# Patient Record
Sex: Female | Born: 1985 | Race: White | Hispanic: No | Marital: Single | Smoking: Current every day smoker
Health system: Southern US, Community
[De-identification: ages and names within clinical notes are randomized; demographics above are authoritative.]

## PROBLEM LIST (undated history)

## (undated) HISTORY — PX: MITRAL VALVE REPLACEMENT: SHX147

## (undated) HISTORY — PX: CHOLECYSTECTOMY: SHX55

---
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
## 2006-02-04 ENCOUNTER — Ambulatory Visit: Payer: Self-pay | Admitting: Oncology

## 2006-06-26 ENCOUNTER — Ambulatory Visit: Payer: Self-pay | Admitting: Oncology

## 2010-05-10 ENCOUNTER — Ambulatory Visit: Payer: Self-pay | Admitting: Internal Medicine

## 2010-05-10 ENCOUNTER — Inpatient Hospital Stay (HOSPITAL_COMMUNITY): Admission: EM | Admit: 2010-05-10 | Discharge: 2010-05-22 | Payer: Self-pay | Admitting: Pulmonary Disease

## 2010-05-10 ENCOUNTER — Ambulatory Visit: Payer: Self-pay | Admitting: Pulmonary Disease

## 2010-05-11 ENCOUNTER — Encounter (INDEPENDENT_AMBULATORY_CARE_PROVIDER_SITE_OTHER): Payer: Self-pay | Admitting: Pulmonary Disease

## 2010-12-15 LAB — POCT I-STAT 3, ART BLOOD GAS (G3+)
Acid-base deficit: 6 mmol/L — ABNORMAL HIGH (ref 0.0–2.0)
Acid-base deficit: 1 mmol/L (ref 0.0–2.0)
Acid-base deficit: 1 mmol/L (ref 0.0–2.0)
Acid-base deficit: 1 mmol/L (ref 0.0–2.0)
Acid-base deficit: 2 mmol/L (ref 0.0–2.0)
Acid-base deficit: 2 mmol/L (ref 0.0–2.0)
Acid-base deficit: 4 mmol/L — ABNORMAL HIGH (ref 0.0–2.0)
Acid-base deficit: 7 mmol/L — ABNORMAL HIGH (ref 0.0–2.0)
Bicarbonate: 19.7 mEq/L — ABNORMAL LOW (ref 20.0–24.0)
Bicarbonate: 20.1 mEq/L (ref 20.0–24.0)
Bicarbonate: 20.3 mEq/L (ref 20.0–24.0)
Bicarbonate: 20.7 mEq/L (ref 20.0–24.0)
Bicarbonate: 22.1 mEq/L (ref 20.0–24.0)
Bicarbonate: 23.8 mEq/L (ref 20.0–24.0)
Bicarbonate: 26.4 mEq/L — ABNORMAL HIGH (ref 20.0–24.0)
Bicarbonate: 29.2 mEq/L — ABNORMAL HIGH (ref 20.0–24.0)
O2 Saturation: 100 %
O2 Saturation: 100 %
O2 Saturation: 100 %
O2 Saturation: 95 %
O2 Saturation: 96 %
O2 Saturation: 98 %
O2 Saturation: 98 %
O2 Saturation: 98 %
O2 Saturation: 98 %
O2 Saturation: 99 %
Patient temperature: 101.6
Patient temperature: 100.2
Patient temperature: 100.4
Patient temperature: 100.5
Patient temperature: 101
Patient temperature: 101.3
Patient temperature: 102.1
Patient temperature: 102.1
Patient temperature: 37
Patient temperature: 98.6
TCO2: 21 mmol/L (ref 0–100)
TCO2: 21 mmol/L (ref 0–100)
TCO2: 21 mmol/L (ref 0–100)
TCO2: 22 mmol/L (ref 0–100)
TCO2: 23 mmol/L (ref 0–100)
TCO2: 25 mmol/L (ref 0–100)
TCO2: 26 mmol/L (ref 0–100)
TCO2: 27 mmol/L (ref 0–100)
TCO2: 28 mmol/L (ref 0–100)
TCO2: 28 mmol/L (ref 0–100)
TCO2: 29 mmol/L (ref 0–100)
TCO2: 31 mmol/L (ref 0–100)
TCO2: 31 mmol/L (ref 0–100)
pCO2 arterial: 35.8 mmHg (ref 35.0–45.0)
pCO2 arterial: 34.1 mmHg — ABNORMAL LOW (ref 35.0–45.0)
pCO2 arterial: 34.7 mmHg — ABNORMAL LOW (ref 35.0–45.0)
pCO2 arterial: 35.3 mmHg (ref 35.0–45.0)
pCO2 arterial: 41.4 mmHg (ref 35.0–45.0)
pCO2 arterial: 45.8 mmHg — ABNORMAL HIGH (ref 35.0–45.0)
pCO2 arterial: 47.6 mmHg — ABNORMAL HIGH (ref 35.0–45.0)
pCO2 arterial: 51 mmHg — ABNORMAL HIGH (ref 35.0–45.0)
pCO2 arterial: 55.8 mmHg — ABNORMAL HIGH (ref 35.0–45.0)
pCO2 arterial: 56.4 mmHg — ABNORMAL HIGH (ref 35.0–45.0)
pCO2 arterial: 56.4 mmHg — ABNORMAL HIGH (ref 35.0–45.0)
pCO2 arterial: 62.4 mmHg (ref 35.0–45.0)
pH, Arterial: 7.291 — ABNORMAL LOW (ref 7.350–7.400)
pH, Arterial: 7.356 (ref 7.350–7.400)
pH, Arterial: 7.278 — ABNORMAL LOW (ref 7.350–7.400)
pH, Arterial: 7.281 — ABNORMAL LOW (ref 7.350–7.400)
pH, Arterial: 7.291 — ABNORMAL LOW (ref 7.350–7.400)
pH, Arterial: 7.329 — ABNORMAL LOW (ref 7.350–7.400)
pH, Arterial: 7.333 — ABNORMAL LOW (ref 7.350–7.400)
pH, Arterial: 7.339 — ABNORMAL LOW (ref 7.350–7.400)
pH, Arterial: 7.368 (ref 7.350–7.400)
pH, Arterial: 7.38 (ref 7.350–7.400)
pH, Arterial: 7.383 (ref 7.350–7.400)
pO2, Arterial: 108 mmHg — ABNORMAL HIGH (ref 80.0–100.0)
pO2, Arterial: 100 mmHg (ref 80.0–100.0)
pO2, Arterial: 119 mmHg — ABNORMAL HIGH (ref 80.0–100.0)
pO2, Arterial: 122 mmHg — ABNORMAL HIGH (ref 80.0–100.0)
pO2, Arterial: 158 mmHg — ABNORMAL HIGH (ref 80.0–100.0)
pO2, Arterial: 212 mmHg — ABNORMAL HIGH (ref 80.0–100.0)
pO2, Arterial: 230 mmHg — ABNORMAL HIGH (ref 80.0–100.0)
pO2, Arterial: 252 mmHg — ABNORMAL HIGH (ref 80.0–100.0)
pO2, Arterial: 427 mmHg — ABNORMAL HIGH (ref 80.0–100.0)
pO2, Arterial: 61 mmHg — ABNORMAL LOW (ref 80.0–100.0)
pO2, Arterial: 98 mmHg (ref 80.0–100.0)

## 2010-12-15 LAB — VANCOMYCIN, TROUGH: Vancomycin Tr: 34.7 ug/mL (ref 10.0–20.0)

## 2010-12-15 LAB — COMPREHENSIVE METABOLIC PANEL
ALT: 35 U/L (ref 0–35)
AST: 54 U/L — ABNORMAL HIGH (ref 0–37)
AST: 48 U/L — ABNORMAL HIGH (ref 0–37)
Albumin: 2.1 g/dL — ABNORMAL LOW (ref 3.5–5.2)
Alkaline Phosphatase: 74 U/L (ref 39–117)
Alkaline Phosphatase: 63 U/L (ref 39–117)
BUN: 74 mg/dL — ABNORMAL HIGH (ref 6–23)
CO2: 18 mEq/L — ABNORMAL LOW (ref 19–32)
Calcium: 8.7 mg/dL (ref 8.4–10.5)
Chloride: 107 mEq/L (ref 96–112)
Chloride: 109 mEq/L (ref 96–112)
Chloride: 105 mEq/L (ref 96–112)
Creatinine, Ser: 6.18 mg/dL — ABNORMAL HIGH (ref 0.4–1.2)
GFR calc Af Amer: 16 mL/min — ABNORMAL LOW (ref >60)
GFR calc non Af Amer: 8 mL/min — ABNORMAL LOW (ref >60)
GFR calc non Af Amer: 8 mL/min — ABNORMAL LOW (ref >60)
Glucose, Bld: 150 mg/dL — ABNORMAL HIGH (ref 70–99)
Glucose, Bld: 98 mg/dL (ref 70–99)
Potassium: 4.4 mEq/L (ref 3.5–5.1)
Potassium: 4.7 mEq/L (ref 3.5–5.1)
Potassium: 4.6 mEq/L (ref 3.5–5.1)
Sodium: 141 mEq/L (ref 135–145)
Total Bilirubin: 0.6 mg/dL (ref 0.3–1.2)
Total Bilirubin: 1.6 mg/dL — ABNORMAL HIGH (ref 0.3–1.2)

## 2010-12-15 LAB — BASIC METABOLIC PANEL
BUN: 34 mg/dL — ABNORMAL HIGH (ref 6–23)
BUN: 74 mg/dL — ABNORMAL HIGH (ref 6–23)
BUN: 83 mg/dL — ABNORMAL HIGH (ref 6–23)
BUN: 19 mg/dL (ref 6–23)
BUN: 20 mg/dL (ref 6–23)
BUN: 25 mg/dL — ABNORMAL HIGH (ref 6–23)
BUN: 53 mg/dL — ABNORMAL HIGH (ref 6–23)
CO2: 20 mEq/L (ref 19–32)
CO2: 21 mEq/L (ref 19–32)
CO2: 19 mEq/L (ref 19–32)
CO2: 21 mEq/L (ref 19–32)
Calcium: 8.6 mg/dL (ref 8.4–10.5)
Calcium: 8.6 mg/dL (ref 8.4–10.5)
Calcium: 8.8 mg/dL (ref 8.4–10.5)
Calcium: 7.2 mg/dL — ABNORMAL LOW (ref 8.4–10.5)
Calcium: 8.4 mg/dL (ref 8.4–10.5)
Chloride: 104 mEq/L (ref 96–112)
Chloride: 105 mEq/L (ref 96–112)
Chloride: 109 mEq/L (ref 96–112)
Chloride: 109 mEq/L (ref 96–112)
Chloride: 110 mEq/L (ref 96–112)
Chloride: 101 mEq/L (ref 96–112)
Creatinine, Ser: 5.04 mg/dL — ABNORMAL HIGH (ref 0.4–1.2)
Creatinine, Ser: 1.85 mg/dL — ABNORMAL HIGH (ref 0.4–1.2)
Creatinine, Ser: 2.1 mg/dL — ABNORMAL HIGH (ref 0.4–1.2)
Creatinine, Ser: 5.07 mg/dL — ABNORMAL HIGH (ref 0.4–1.2)
Creatinine, Ser: 5.87 mg/dL — ABNORMAL HIGH (ref 0.4–1.2)
GFR calc Af Amer: 16 mL/min — ABNORMAL LOW (ref >60)
GFR calc Af Amer: 17 mL/min — ABNORMAL LOW (ref >60)
GFR calc Af Amer: 24 mL/min — ABNORMAL LOW (ref >60)
GFR calc Af Amer: 27 mL/min — ABNORMAL LOW (ref >60)
GFR calc Af Amer: 11 mL/min — ABNORMAL LOW (ref >60)
GFR calc Af Amer: 13 mL/min — ABNORMAL LOW (ref >60)
GFR calc Af Amer: 23 mL/min — ABNORMAL LOW (ref >60)
GFR calc non Af Amer: 10 mL/min — ABNORMAL LOW (ref >60)
GFR calc non Af Amer: 14 mL/min — ABNORMAL LOW (ref >60)
GFR calc non Af Amer: 15 mL/min — ABNORMAL LOW (ref >60)
GFR calc non Af Amer: 20 mL/min — ABNORMAL LOW (ref >60)
GFR calc non Af Amer: 23 mL/min — ABNORMAL LOW (ref >60)
GFR calc non Af Amer: 10 mL/min — ABNORMAL LOW (ref >60)
GFR calc non Af Amer: 19 mL/min — ABNORMAL LOW (ref >60)
GFR calc non Af Amer: 33 mL/min — ABNORMAL LOW (ref >60)
GFR calc non Af Amer: 9 mL/min — ABNORMAL LOW (ref >60)
Glucose, Bld: 109 mg/dL — ABNORMAL HIGH (ref 70–99)
Glucose, Bld: 151 mg/dL — ABNORMAL HIGH (ref 70–99)
Potassium: 2.8 mEq/L — ABNORMAL LOW (ref 3.5–5.1)
Potassium: 3.3 mEq/L — ABNORMAL LOW (ref 3.5–5.1)
Potassium: 3.4 mEq/L — ABNORMAL LOW (ref 3.5–5.1)
Potassium: 3.9 mEq/L (ref 3.5–5.1)
Potassium: 4 mEq/L (ref 3.5–5.1)
Potassium: 4.1 mEq/L (ref 3.5–5.1)
Potassium: 2.9 mEq/L — ABNORMAL LOW (ref 3.5–5.1)
Potassium: 3.4 mEq/L — ABNORMAL LOW (ref 3.5–5.1)
Potassium: 3.5 mEq/L (ref 3.5–5.1)
Potassium: 4.3 mEq/L (ref 3.5–5.1)
Sodium: 139 mEq/L (ref 135–145)
Sodium: 140 mEq/L (ref 135–145)
Sodium: 141 mEq/L (ref 135–145)
Sodium: 141 mEq/L (ref 135–145)
Sodium: 142 mEq/L (ref 135–145)
Sodium: 142 mEq/L (ref 135–145)
Sodium: 137 mEq/L (ref 135–145)
Sodium: 138 mEq/L (ref 135–145)

## 2010-12-15 LAB — CBC
HCT: 17.9 % — ABNORMAL LOW (ref 36.0–46.0)
HCT: 22.1 % — ABNORMAL LOW (ref 36.0–46.0)
HCT: 22.6 % — ABNORMAL LOW (ref 36.0–46.0)
HCT: 22.8 % — ABNORMAL LOW (ref 36.0–46.0)
HCT: 23.2 % — ABNORMAL LOW (ref 36.0–46.0)
HCT: 24.5 % — ABNORMAL LOW (ref 36.0–46.0)
HCT: 24.6 % — ABNORMAL LOW (ref 36.0–46.0)
HCT: 24.7 % — ABNORMAL LOW (ref 36.0–46.0)
Hemoglobin: 7.1 g/dL — ABNORMAL LOW (ref 12.0–15.0)
Hemoglobin: 7.4 g/dL — ABNORMAL LOW (ref 12.0–15.0)
Hemoglobin: 7.4 g/dL — ABNORMAL LOW (ref 12.0–15.0)
Hemoglobin: 7.7 g/dL — ABNORMAL LOW (ref 12.0–15.0)
Hemoglobin: 7.8 g/dL — ABNORMAL LOW (ref 12.0–15.0)
Hemoglobin: 8 g/dL — ABNORMAL LOW (ref 12.0–15.0)
Hemoglobin: 7.1 g/dL — ABNORMAL LOW (ref 12.0–15.0)
MCH: 27.1 pg (ref 26.0–34.0)
MCH: 26.3 pg (ref 26.0–34.0)
MCH: 26.5 pg (ref 26.0–34.0)
MCH: 26.7 pg (ref 26.0–34.0)
MCHC: 32.5 g/dL (ref 30.0–36.0)
MCHC: 32.7 g/dL (ref 30.0–36.0)
MCHC: 33.2 g/dL (ref 30.0–36.0)
MCHC: 31.9 g/dL (ref 30.0–36.0)
MCHC: 32 g/dL (ref 30.0–36.0)
MCV: 82.2 fL (ref 78.0–100.0)
MCV: 82.9 fL (ref 78.0–100.0)
MCV: 82.9 fL (ref 78.0–100.0)
MCV: 84.2 fL (ref 78.0–100.0)
MCV: 84.3 fL (ref 78.0–100.0)
MCV: 83.2 fL (ref 78.0–100.0)
Platelets: 475 10*3/uL — ABNORMAL HIGH (ref 150–400)
Platelets: 220 10*3/uL (ref 150–400)
Platelets: 239 10*3/uL (ref 150–400)
Platelets: 246 10*3/uL (ref 150–400)
Platelets: 263 10*3/uL (ref 150–400)
Platelets: 265 10*3/uL (ref 150–400)
Platelets: 279 10*3/uL (ref 150–400)
Platelets: 284 10*3/uL (ref 150–400)
RBC: 2.16 MIL/uL — ABNORMAL LOW (ref 3.87–5.11)
RBC: 2.77 MIL/uL — ABNORMAL LOW (ref 3.87–5.11)
RBC: 2.92 MIL/uL — ABNORMAL LOW (ref 3.87–5.11)
RBC: 2.93 MIL/uL — ABNORMAL LOW (ref 3.87–5.11)
RBC: 2.7 MIL/uL — ABNORMAL LOW (ref 3.87–5.11)
RBC: 2.83 MIL/uL — ABNORMAL LOW (ref 3.87–5.11)
RBC: 2.98 MIL/uL — ABNORMAL LOW (ref 3.87–5.11)
RDW: 16.6 % — ABNORMAL HIGH (ref 11.5–15.5)
RDW: 16.6 % — ABNORMAL HIGH (ref 11.5–15.5)
RDW: 16.9 % — ABNORMAL HIGH (ref 11.5–15.5)
RDW: 17.1 % — ABNORMAL HIGH (ref 11.5–15.5)
RDW: 17.3 % — ABNORMAL HIGH (ref 11.5–15.5)
RDW: 16 % — ABNORMAL HIGH (ref 11.5–15.5)
RDW: 16 % — ABNORMAL HIGH (ref 11.5–15.5)
RDW: 16.4 % — ABNORMAL HIGH (ref 11.5–15.5)
RDW: 17.1 % — ABNORMAL HIGH (ref 11.5–15.5)
WBC: 11.3 10*3/uL — ABNORMAL HIGH (ref 4.0–10.5)
WBC: 12.6 10*3/uL — ABNORMAL HIGH (ref 4.0–10.5)
WBC: 15 10*3/uL — ABNORMAL HIGH (ref 4.0–10.5)
WBC: 15.1 10*3/uL — ABNORMAL HIGH (ref 4.0–10.5)
WBC: 9.2 10*3/uL (ref 4.0–10.5)
WBC: 13.5 10*3/uL — ABNORMAL HIGH (ref 4.0–10.5)
WBC: 16.7 10*3/uL — ABNORMAL HIGH (ref 4.0–10.5)
WBC: 16.7 10*3/uL — ABNORMAL HIGH (ref 4.0–10.5)
WBC: 9.2 10*3/uL (ref 4.0–10.5)

## 2010-12-15 LAB — GLUCOSE, CAPILLARY
Glucose-Capillary: 100 mg/dL — ABNORMAL HIGH (ref 70–99)
Glucose-Capillary: 114 mg/dL — ABNORMAL HIGH (ref 70–99)
Glucose-Capillary: 114 mg/dL — ABNORMAL HIGH (ref 70–99)
Glucose-Capillary: 122 mg/dL — ABNORMAL HIGH (ref 70–99)
Glucose-Capillary: 132 mg/dL — ABNORMAL HIGH (ref 70–99)
Glucose-Capillary: 136 mg/dL — ABNORMAL HIGH (ref 70–99)
Glucose-Capillary: 142 mg/dL — ABNORMAL HIGH (ref 70–99)
Glucose-Capillary: 148 mg/dL — ABNORMAL HIGH (ref 70–99)
Glucose-Capillary: 105 mg/dL — ABNORMAL HIGH (ref 70–99)
Glucose-Capillary: 106 mg/dL — ABNORMAL HIGH (ref 70–99)
Glucose-Capillary: 108 mg/dL — ABNORMAL HIGH (ref 70–99)
Glucose-Capillary: 110 mg/dL — ABNORMAL HIGH (ref 70–99)
Glucose-Capillary: 113 mg/dL — ABNORMAL HIGH (ref 70–99)
Glucose-Capillary: 116 mg/dL — ABNORMAL HIGH (ref 70–99)
Glucose-Capillary: 125 mg/dL — ABNORMAL HIGH (ref 70–99)
Glucose-Capillary: 129 mg/dL — ABNORMAL HIGH (ref 70–99)
Glucose-Capillary: 134 mg/dL — ABNORMAL HIGH (ref 70–99)
Glucose-Capillary: 134 mg/dL — ABNORMAL HIGH (ref 70–99)
Glucose-Capillary: 150 mg/dL — ABNORMAL HIGH (ref 70–99)
Glucose-Capillary: 206 mg/dL — ABNORMAL HIGH (ref 70–99)
Glucose-Capillary: 218 mg/dL — ABNORMAL HIGH (ref 70–99)
Glucose-Capillary: 223 mg/dL — ABNORMAL HIGH (ref 70–99)
Glucose-Capillary: 232 mg/dL — ABNORMAL HIGH (ref 70–99)
Glucose-Capillary: 246 mg/dL — ABNORMAL HIGH (ref 70–99)
Glucose-Capillary: 259 mg/dL — ABNORMAL HIGH (ref 70–99)
Glucose-Capillary: 75 mg/dL (ref 70–99)
Glucose-Capillary: 76 mg/dL (ref 70–99)
Glucose-Capillary: 83 mg/dL (ref 70–99)
Glucose-Capillary: 92 mg/dL (ref 70–99)
Glucose-Capillary: 98 mg/dL (ref 70–99)

## 2010-12-15 LAB — CULTURE, BLOOD (ROUTINE X 2)
Culture: NO GROWTH
Culture: NO GROWTH

## 2010-12-15 LAB — BLOOD GAS, ARTERIAL
Acid-base deficit: 7 mmol/L — ABNORMAL HIGH (ref 0.0–2.0)
Drawn by: 129711
Mode: POSITIVE
PEEP: 5 cmH2O
Pressure support: 12 cmH2O
pCO2 arterial: 33 mmHg — ABNORMAL LOW (ref 35.0–45.0)

## 2010-12-15 LAB — PROCALCITONIN
Procalcitonin: 3.09 ng/mL
Procalcitonin: 4.76 ng/mL

## 2010-12-15 LAB — URINE CULTURE
Colony Count: NO GROWTH
Culture  Setup Time: 201108110841
Special Requests: NEGATIVE

## 2010-12-15 LAB — DIFFERENTIAL
Basophils Absolute: 0.1 10*3/uL (ref 0.0–0.1)
Basophils Relative: 1 % (ref 0–1)
Eosinophils Absolute: 0.1 10*3/uL (ref 0.0–0.7)
Monocytes Absolute: 0.3 10*3/uL (ref 0.1–1.0)
Neutrophils Relative %: 89 % — ABNORMAL HIGH (ref 43–77)

## 2010-12-15 LAB — CK: Total CK: 642 U/L — ABNORMAL HIGH (ref 7–177)

## 2010-12-15 LAB — RENAL FUNCTION PANEL
CO2: 25 mEq/L (ref 19–32)
Calcium: 8.7 mg/dL (ref 8.4–10.5)
Chloride: 100 mEq/L (ref 96–112)
GFR calc Af Amer: 21 mL/min — ABNORMAL LOW (ref >60)
GFR calc non Af Amer: 17 mL/min — ABNORMAL LOW (ref >60)
Glucose, Bld: 112 mg/dL — ABNORMAL HIGH (ref 70–99)
Sodium: 136 mEq/L (ref 135–145)

## 2010-12-15 LAB — MAGNESIUM
Magnesium: 1.9 mg/dL (ref 1.5–2.5)
Magnesium: 2 mg/dL (ref 1.5–2.5)
Magnesium: 2.3 mg/dL (ref 1.5–2.5)
Magnesium: 2.6 mg/dL — ABNORMAL HIGH (ref 1.5–2.5)
Magnesium: 1.8 mg/dL (ref 1.5–2.5)
Magnesium: 1.9 mg/dL (ref 1.5–2.5)

## 2010-12-15 LAB — HEPARIN LEVEL (UNFRACTIONATED)
Heparin Unfractionated: 0.37 IU/mL (ref 0.30–0.70)
Heparin Unfractionated: 0.42 IU/mL (ref 0.30–0.70)
Heparin Unfractionated: 0.44 IU/mL (ref 0.30–0.70)
Heparin Unfractionated: 0.5 IU/mL (ref 0.30–0.70)
Heparin Unfractionated: 0.19 IU/mL — ABNORMAL LOW (ref 0.30–0.70)
Heparin Unfractionated: 0.2 IU/mL — ABNORMAL LOW (ref 0.30–0.70)
Heparin Unfractionated: 0.28 IU/mL — ABNORMAL LOW (ref 0.30–0.70)
Heparin Unfractionated: 0.31 IU/mL (ref 0.30–0.70)

## 2010-12-15 LAB — CARDIAC PANEL(CRET KIN+CKTOT+MB+TROPI)
CK, MB: 29 ng/mL (ref 0.3–4.0)
Relative Index: 1.3 (ref 0.0–2.5)
Relative Index: 2.3 (ref 0.0–2.5)
Total CK: 136 U/L (ref 7–177)
Troponin I: 0.01 ng/mL (ref 0.00–0.06)
Troponin I: 0.03 ng/mL (ref 0.00–0.06)

## 2010-12-15 LAB — ABO/RH: ABO/RH(D): A POS

## 2010-12-15 LAB — TYPE AND SCREEN: ABO/RH(D): A POS

## 2010-12-15 LAB — PROTIME-INR
INR: 1.53 — ABNORMAL HIGH (ref 0.00–1.49)
INR: 1.79 — ABNORMAL HIGH (ref 0.00–1.49)
INR: 1.82 — ABNORMAL HIGH (ref 0.00–1.49)
INR: 1.41 (ref 0.00–1.49)
Prothrombin Time: 21 seconds — ABNORMAL HIGH (ref 11.6–15.2)
Prothrombin Time: 21.2 seconds — ABNORMAL HIGH (ref 11.6–15.2)
Prothrombin Time: 17.5 seconds — ABNORMAL HIGH (ref 11.6–15.2)

## 2010-12-15 LAB — URINALYSIS, ROUTINE W REFLEX MICROSCOPIC
Glucose, UA: 100 mg/dL — AB
Glucose, UA: NEGATIVE mg/dL
Ketones, ur: 15 mg/dL — AB
Leukocytes, UA: NEGATIVE
Nitrite: NEGATIVE
Specific Gravity, Urine: 1.013 (ref 1.005–1.030)
pH: 5.5 (ref 5.0–8.0)

## 2010-12-15 LAB — HEMOCCULT GUIAC POC 1CARD (OFFICE)
Fecal Occult Bld: NEGATIVE
Fecal Occult Bld: POSITIVE

## 2010-12-15 LAB — CATH TIP CULTURE: Culture: NO GROWTH

## 2010-12-15 LAB — C-REACTIVE PROTEIN: CRP: 33.7 mg/dL — ABNORMAL HIGH (ref <0.6)

## 2010-12-15 LAB — STREP PNEUMONIAE URINARY ANTIGEN: Strep Pneumo Urinary Antigen: NEGATIVE

## 2010-12-15 LAB — MRSA PCR SCREENING: MRSA by PCR: NEGATIVE

## 2010-12-15 LAB — GLOMERULAR BASEMENT MEMBRANE ANTIBODIES

## 2010-12-15 LAB — CULTURE, BAL-QUANTITATIVE W GRAM STAIN

## 2010-12-15 LAB — PHOSPHORUS
Phosphorus: 3 mg/dL (ref 2.3–4.6)
Phosphorus: 4.3 mg/dL (ref 2.3–4.6)

## 2010-12-15 LAB — CORTISOL: Cortisol, Plasma: 13.8 ug/dL

## 2010-12-15 LAB — URINE MICROSCOPIC-ADD ON

## 2010-12-15 LAB — ANA: Anti Nuclear Antibody(ANA): NEGATIVE

## 2010-12-15 LAB — LEGIONELLA ANTIGEN, URINE

## 2010-12-15 LAB — RHEUMATOID FACTOR: Rhuematoid fact SerPl-aCnc: <20 IU/mL (ref 0–20)

## 2010-12-15 LAB — HEMOGLOBIN AND HEMATOCRIT, BLOOD
HCT: 23.6 % — ABNORMAL LOW (ref 36.0–46.0)
Hemoglobin: 7.4 g/dL — ABNORMAL LOW (ref 12.0–15.0)

## 2010-12-15 LAB — ANTI-NEUTROPHIL ANTIBODY

## 2010-12-15 LAB — NA AND K (SODIUM & POTASSIUM), RAND UR: Sodium, Ur: 65 mEq/L

## 2011-08-14 IMAGING — CT CT CHEST W/O CM
3 series · 17 of 30 positions shown, 18 images · non-contrast
Comparison: Chest radiograph performed earlier today at [DATE] p.m.

CLINICAL DATA: Bilateral pneumonia with respiratory distress;
sepsis.  Assess for hemothorax.  On ventilation.

CT CHEST WITHOUT CONTRAST
TECHNIQUE: Multidetector CT imaging of the chest was performed
following the standard protocol without IV contrast.

[Series 2: routine chest · axial · 0.70mm/px · z∈[-115,-80]mm · 2 of 46 slices shown, 3 images]
[im 23/46  mediastinal]
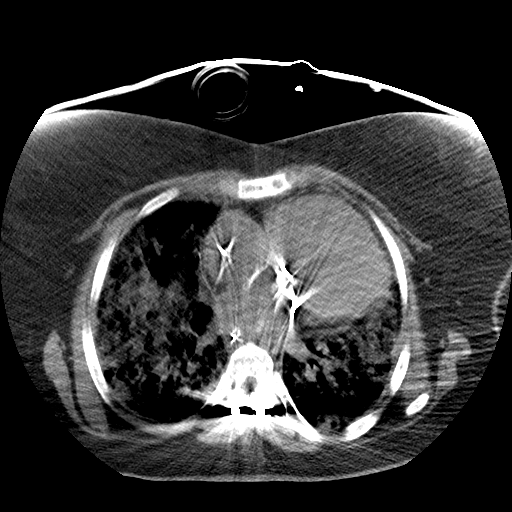
[im 23/46  lung]
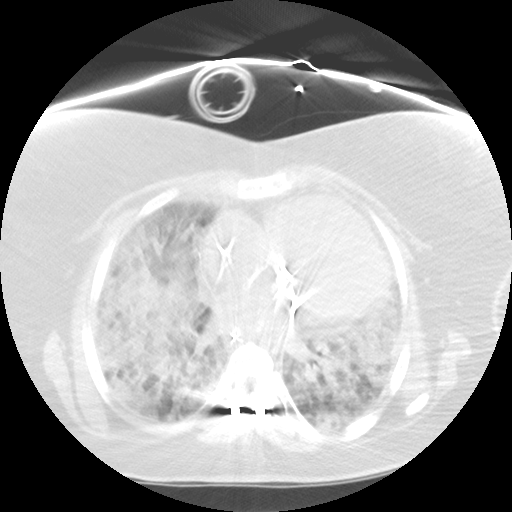
[im 30/46  lung]
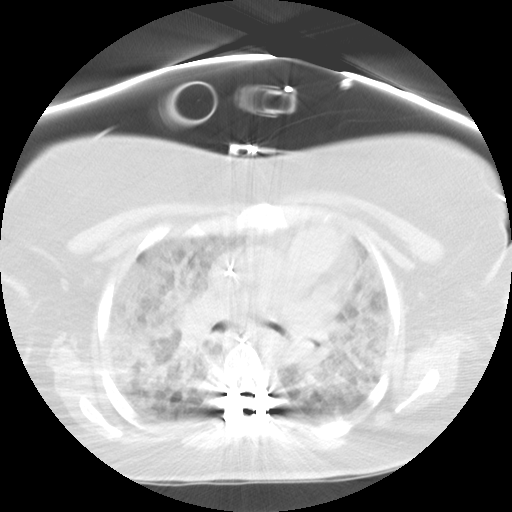

[Series 400: reformatted · sagittal · 0.70mm/px · 8 of 171 slices shown (1 of 2)]
[im 15/171  lung]
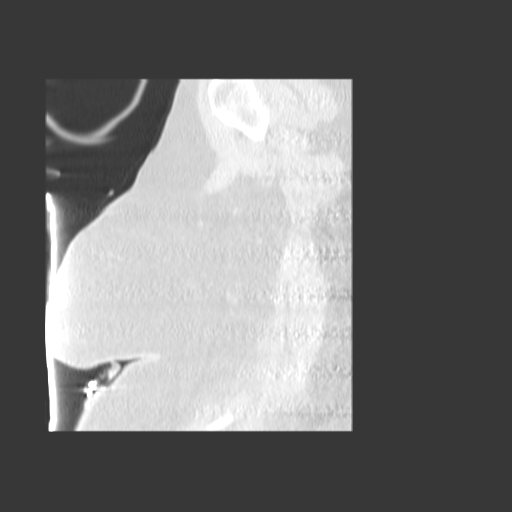
[im 43/171  lung]
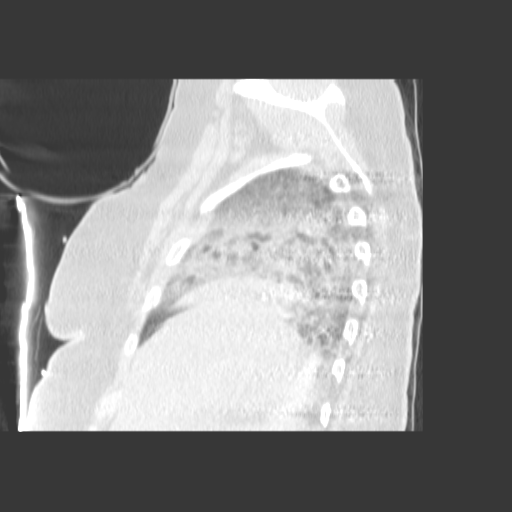
[im 57/171  lung]
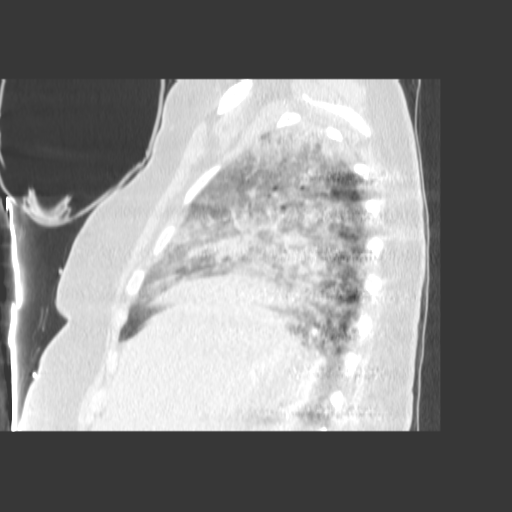
[im 71/171  lung]
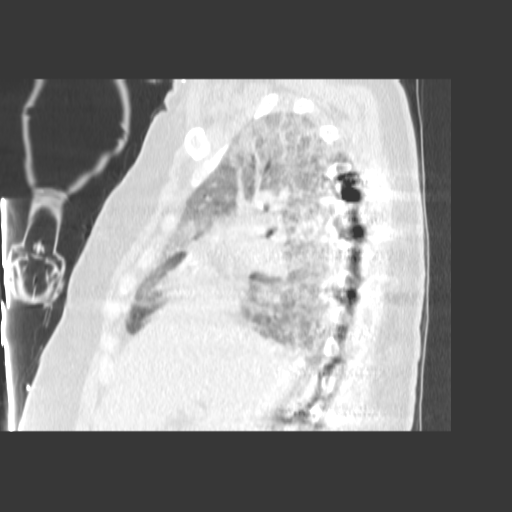
[im 100/171  lung]
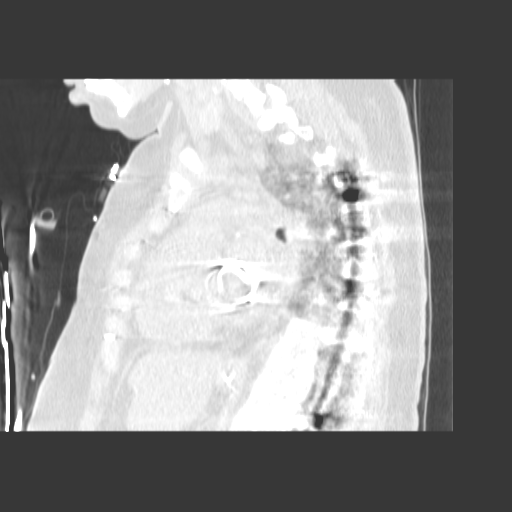
[im 114/171  lung]
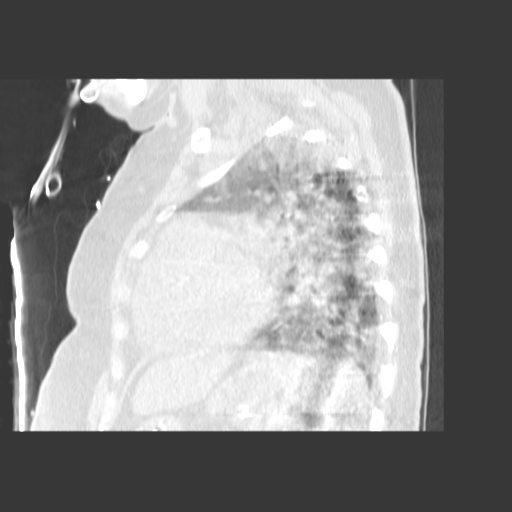
[im 128/171  lung]
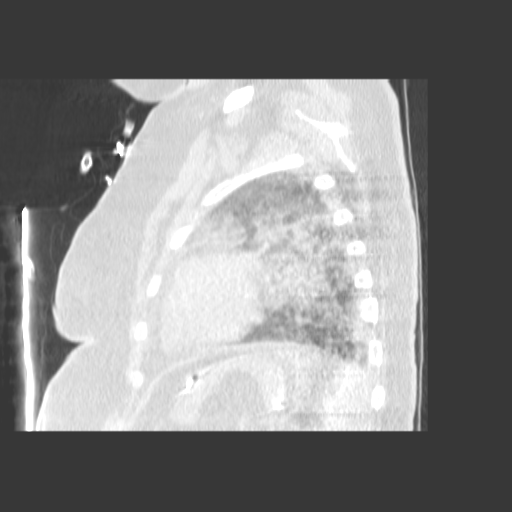
[im 156/171  lung]
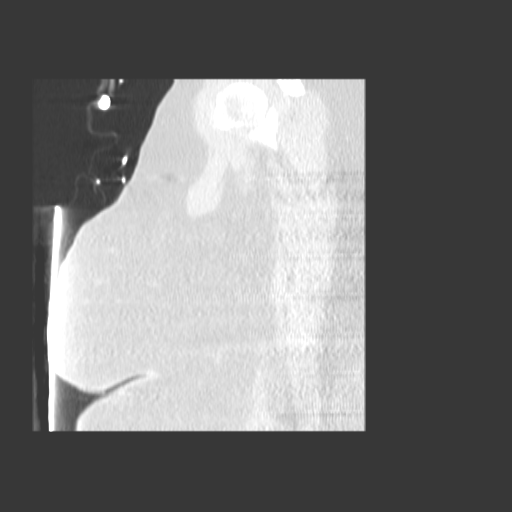

[Series 401: reformatted · coronal · 0.70mm/px · 7 of 158 slices shown (2 of 2)]
[im 15/158  lung]
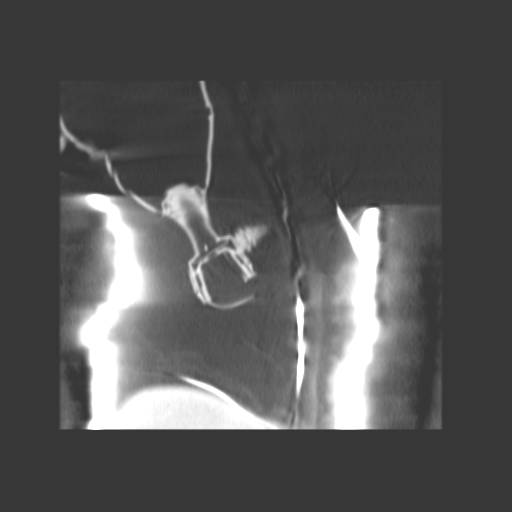
[im 43/158  lung]
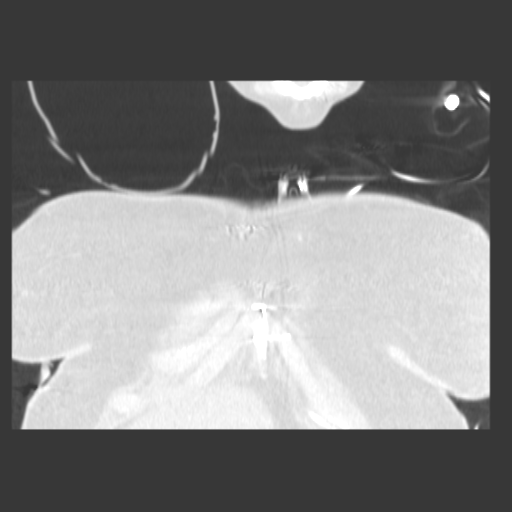
[im 58/158  lung]
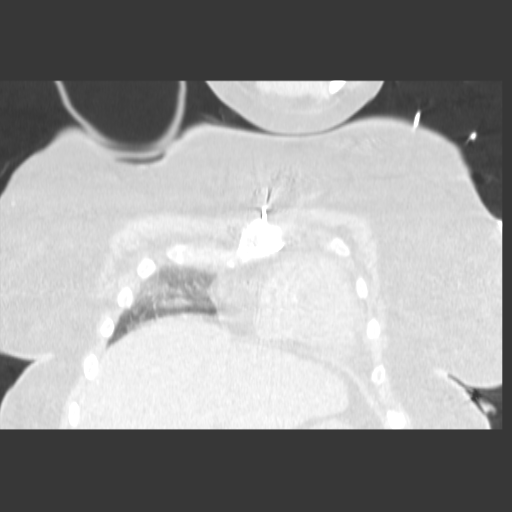
[im 72/158  lung]
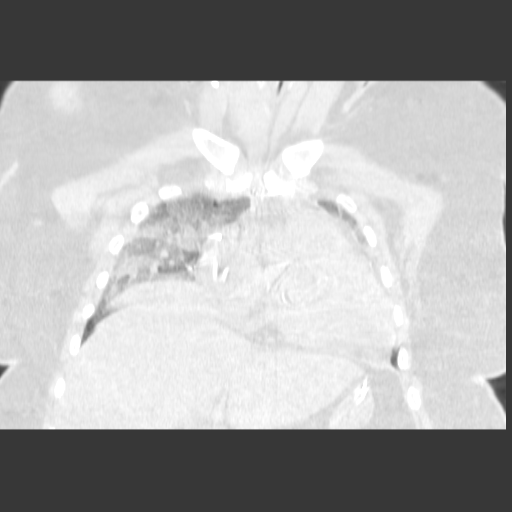
[im 86/158  lung]
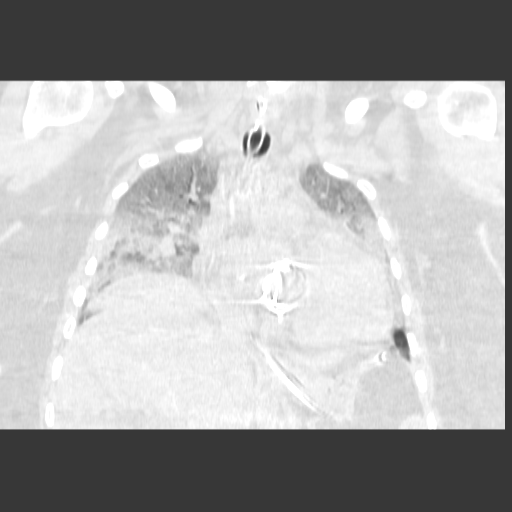
[im 100/158  lung]
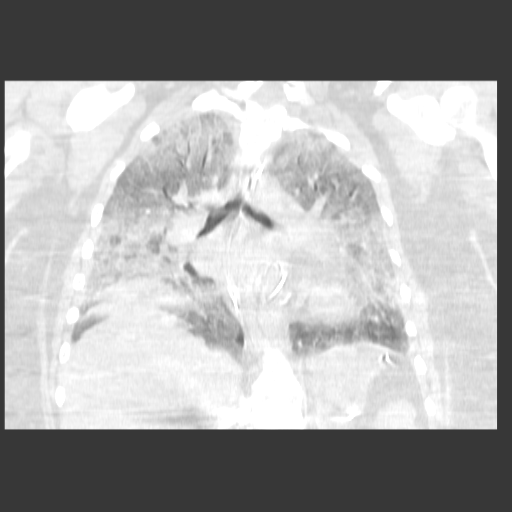
[im 115/158  lung]
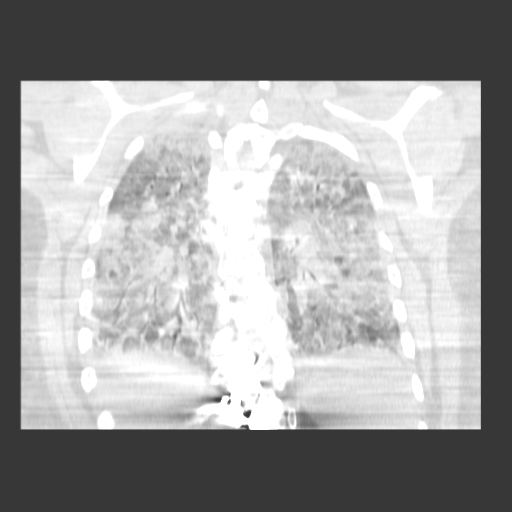

[17 of 30 positions shown; findings below may reference images not displayed]

FINDINGS: There is extensive diffuse airspace opacification
involving the entirety of both lungs, more fluffy at the mid lung
zones, with complete alveolar opacification seen bilaterally.  No
significant pleural effusion or pneumothorax is seen; there is no
evidence for hemothorax.

This appearance is most compatible with either flash pulmonary
edema or ARDS; pneumonia of this extent is less likely.

The patient's endotracheal tube is seen ending 1 cm above the
carina.  The mediastinum is grossly unremarkable in appearance.
Scattered numerous small mediastinal nodes are noted, without
definite mediastinal lymphadenopathy.  A mitral valve replacement
is noted.  The patient's right IJ line is seen ending within the
proximal right atrium.  No pericardial effusion is seen. The great
vessels are unremarkable in appearance.

The thyroid gland is unremarkable.  No axillary lymphadenopathy is
seen.  The visualized portions of the liver and spleen are
unremarkable in appearance.  An enteric tube is noted extending
into the body of the stomach.

There is chronic fusion of the thoracic spine, with associated
thoracolumbar spinal fusion rods.  The patient is status post
median sternotomy.  No acute osseous abnormalities are seen.
IMPRESSION: 1.  Extensive diffuse airspace opacification involving the entirety
of both lungs, with complete alveolar opacification.  No pleural
effusion seen.  No evidence for hemothorax. Findings most
compatible with either flash pulmonary edema or ARDS; pneumonia to
this extent is unlikely.
2.  Endotracheal tube seen ending 1 cm above the carina.  This
should be retracted 1-2 cm.
3.  Right IJ line seen ending in the proximal right atrium; this
should be retracted 2 cm.

Findings were discussed with Edomobi RN on [id number] at [DATE] a.m.
on 05/11/2010.

## 2011-08-15 IMAGING — CR DG CHEST 1V PORT
1 series · 1 of 1 positions shown · non-contrast
Comparison: Chest radiograph and CT of the chest performed
05/10/2010

CLINICAL DATA: Decreasing O2 saturation; bilateral pneumonia with
sepsis.

PORTABLE CHEST - 1 VIEW

[AP]
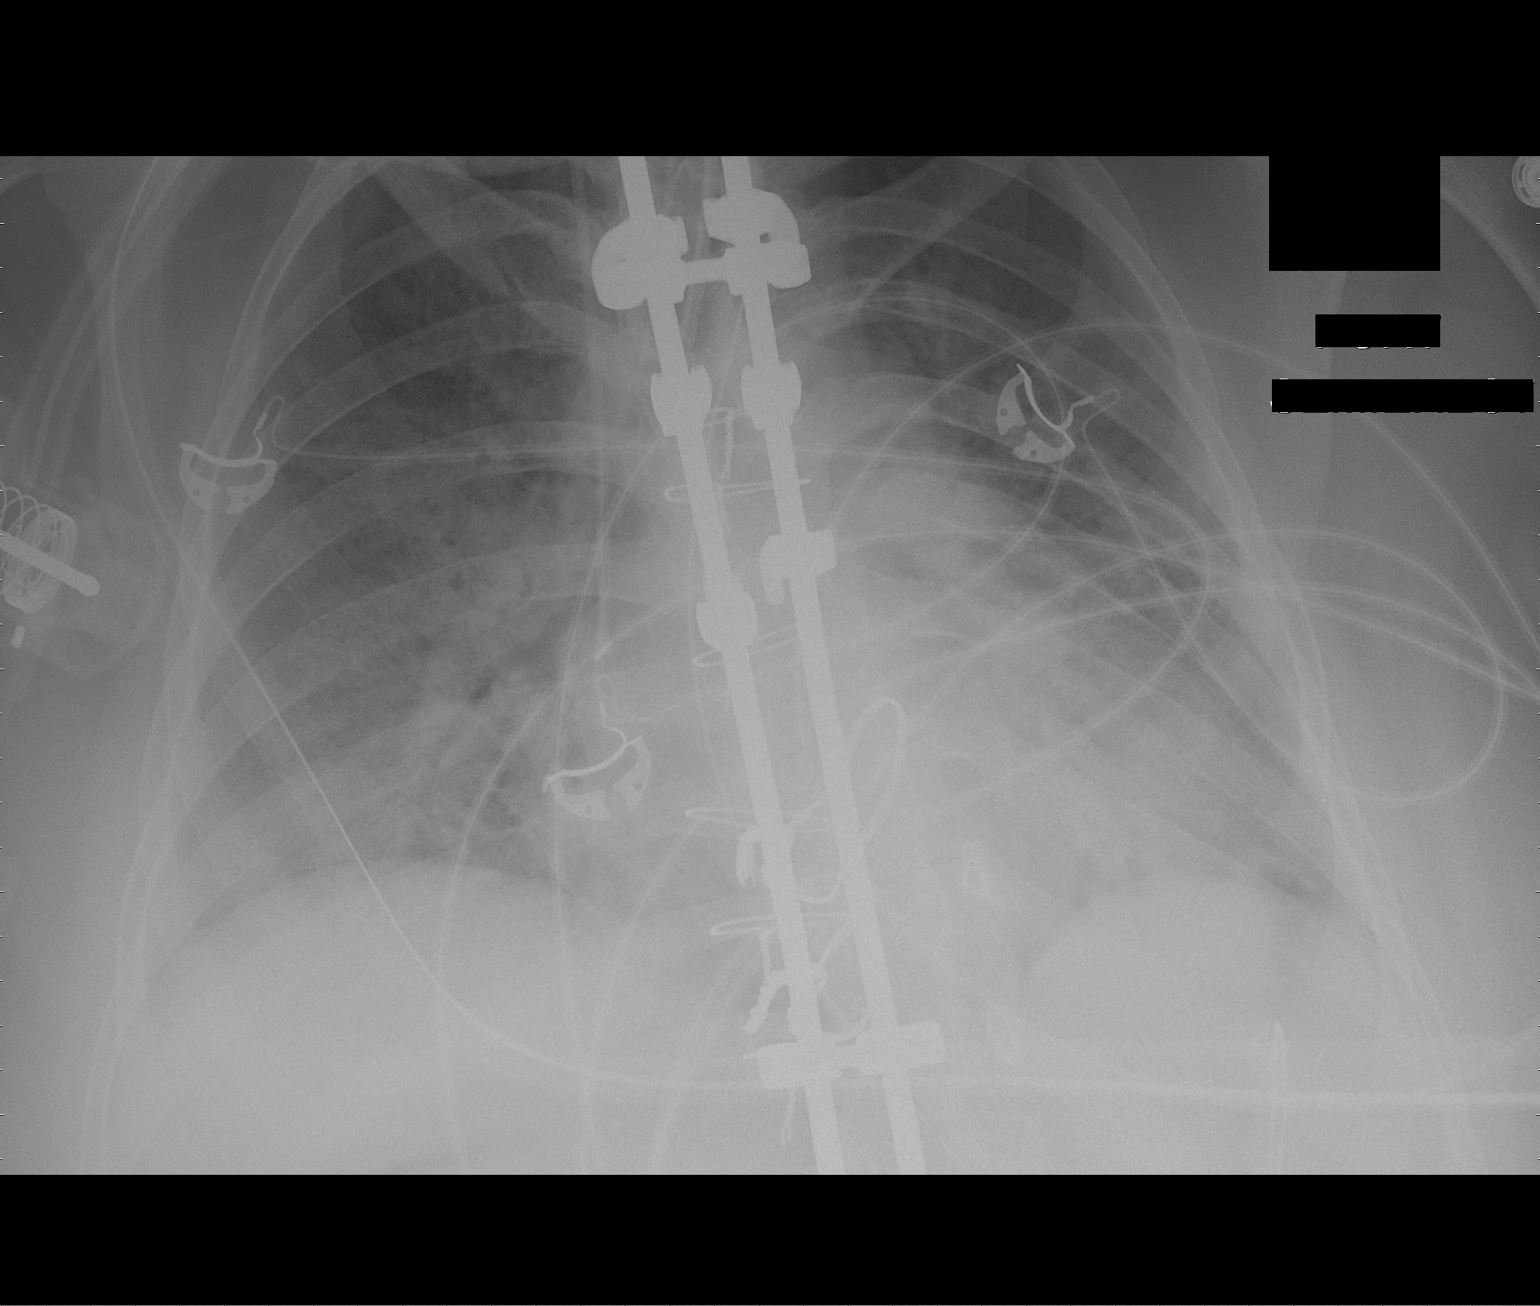

[1 of 1 positions shown; findings below may reference images not displayed]

FINDINGS: There has been interval improvement in aeration, with
clearing of the lung apices.  Significant central and bibasilar
airspace opacity is still seen.  This may reflect ARDS or residual
flash pulmonary edema; underlying pneumonia cannot be excluded,
given clinical concern.

The patient's endotracheal tube tip is not well seen due to
overlying hardware, but likely ends 1-2 cm above the carina.  A
right IJ line is noted ending about the cavoatrial junction.  The
patient's enteric tube is noted extending below the diaphragm.

The cardiomediastinal silhouette is borderline enlarged.  No acute
osseous abnormalities are seen.
IMPRESSION: Interval improvement in aeration, with clearing of the lung apices;
persistent central bibasilar airspace opacity may reflect ARDS or
residual flash pulmonary edema.  Underlying pneumonia cannot be
excluded, given clinical concern.

## 2011-08-17 IMAGING — CR DG CHEST 1V PORT
1 series · 1 of 1 positions shown · non-contrast
Comparison: 05/12/2010

CLINICAL DATA: Pneumonia and respiratory failure.

PORTABLE CHEST - 1 VIEW

[AP]
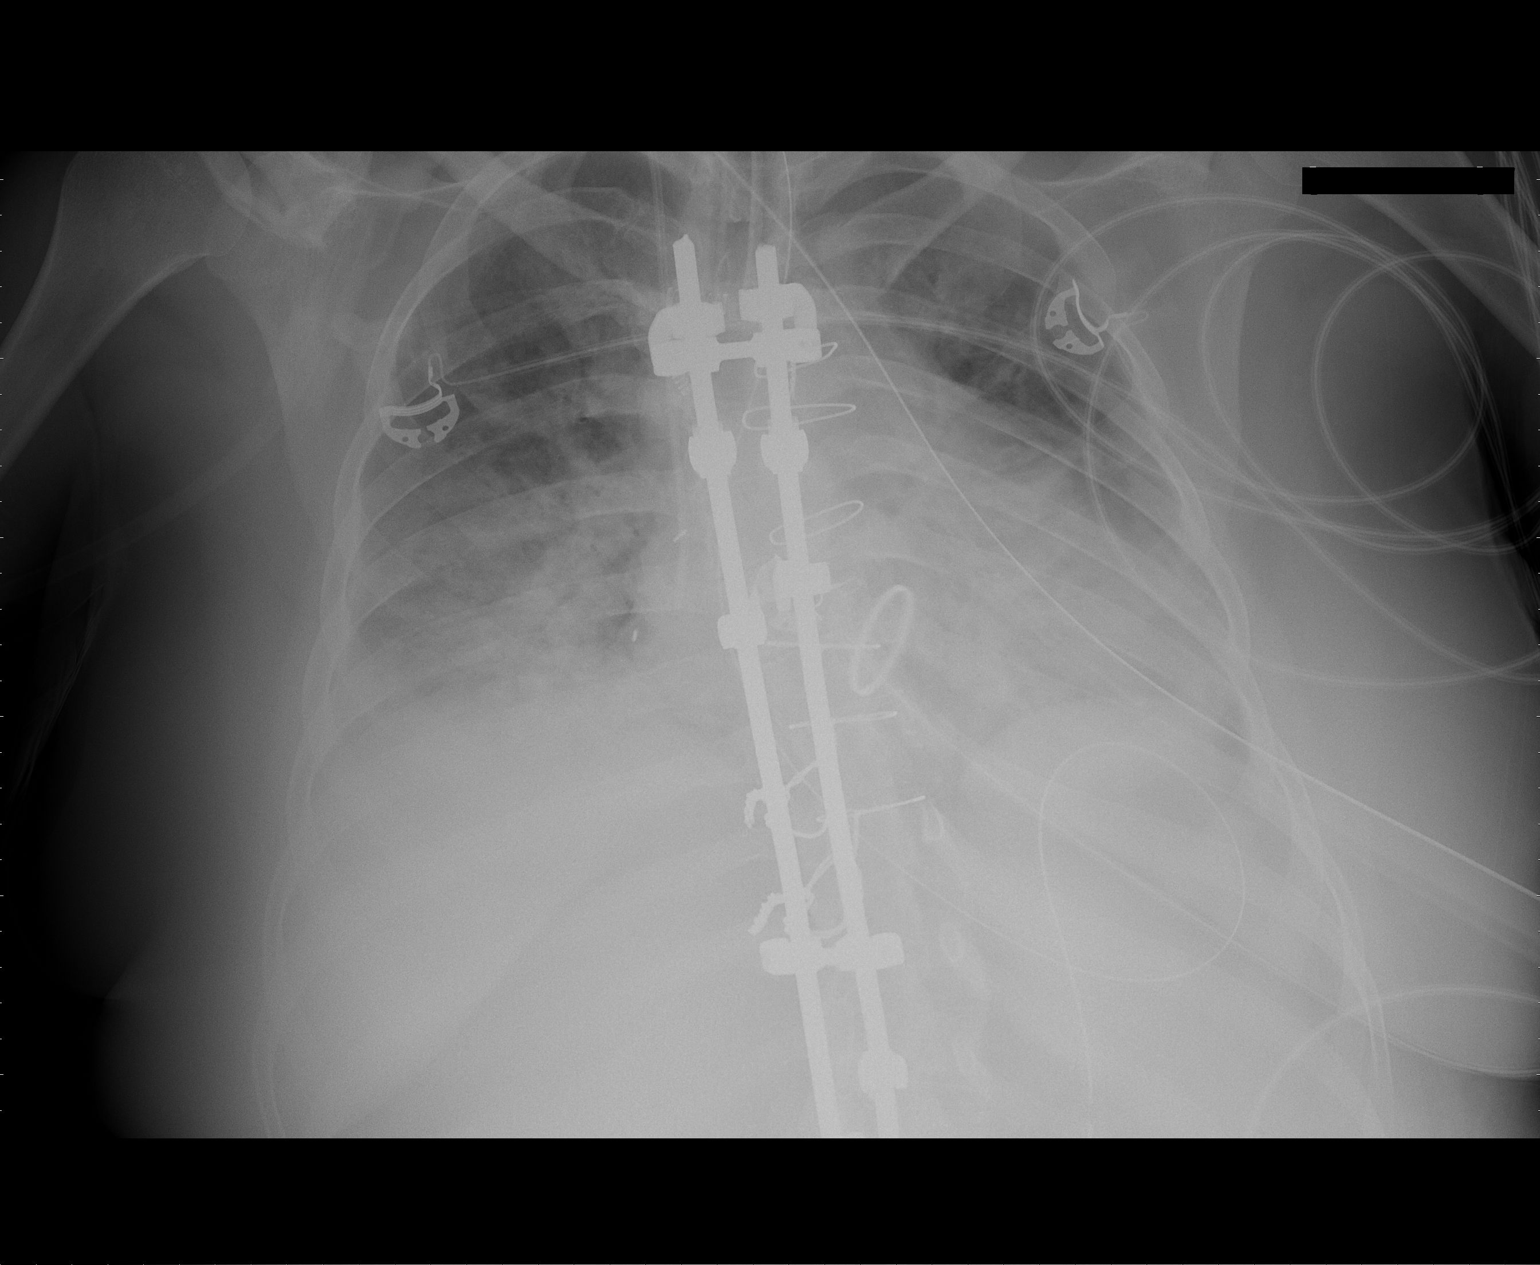

[1 of 1 positions shown; findings below may reference images not displayed]

FINDINGS: Endotracheal tube tip approximately 2.3 cm above the
carina.  Stable positioning of central line.  Lungs show decreased
volumes since yesterday's film with persistent bilateral
infiltrates.  No overt edema.
IMPRESSION: Decreased lung volumes since prior radiograph with persistent
bilateral infiltrates.

## 2016-04-04 ENCOUNTER — Emergency Department (HOSPITAL_COMMUNITY)
Admission: EM | Admit: 2016-04-04 | Discharge: 2016-04-04 | Disposition: A | Discharge status: Home / Self Care | Payer: Medicaid Other | Attending: Emergency Medicine | Admitting: Emergency Medicine

## 2016-04-04 ENCOUNTER — Encounter (HOSPITAL_COMMUNITY): Payer: Self-pay

## 2016-04-04 DIAGNOSIS — F17210 Nicotine dependence, cigarettes, uncomplicated: Secondary | ICD-10-CM | POA: Diagnosis not present

## 2016-04-04 DIAGNOSIS — L0231 Cutaneous abscess of buttock: Secondary | ICD-10-CM | POA: Diagnosis not present

## 2016-04-04 DIAGNOSIS — Z7901 Long term (current) use of anticoagulants: Secondary | ICD-10-CM | POA: Insufficient documentation

## 2016-04-04 MED ORDER — LIDOCAINE HCL (PF) 1 % IJ SOLN
30.0000 mL | Freq: Once | INTRAMUSCULAR | Status: AC
  Administered 2016-04-04: 30 mL
  Filled 2016-04-04: qty 30

## 2016-04-04 NOTE — ED Notes (Signed)
PA at bedside.

## 2016-04-04 NOTE — Discharge Instructions (Signed)
Abscess °An abscess is an infected area that contains a collection of pus and debris. It can occur in almost any part of the body. An abscess is also known as a furuncle or boil. °CAUSES  °An abscess occurs when tissue gets infected. This can occur from blockage of oil or sweat glands, infection of hair follicles, or a minor injury to the skin. As the body tries to fight the infection, pus collects in the area and creates pressure under the skin. This pressure causes pain. People with weakened immune systems have difficulty fighting infections and get certain abscesses more often.  °SYMPTOMS °Usually an abscess develops on the skin and becomes a painful mass that is red, warm, and tender. If the abscess forms under the skin, you may feel a moveable soft area under the skin. Some abscesses break open (rupture) on their own, but most will continue to get worse without care. The infection can spread deeper into the body and eventually into the bloodstream, causing you to feel ill.  °DIAGNOSIS  °Your caregiver will take your medical history and perform a physical exam. A sample of fluid may also be taken from the abscess to determine what is causing your infection. °TREATMENT  °Your caregiver may prescribe antibiotic medicines to fight the infection. However, taking antibiotics alone usually does not cure an abscess. Your caregiver may need to make a small cut (incision) in the abscess to drain the pus. In some cases, gauze is packed into the abscess to reduce pain and to continue draining the area. °HOME CARE INSTRUCTIONS  °· Only take over-the-counter or prescription medicines for pain, discomfort, or fever as directed by your caregiver. °· If you were prescribed antibiotics, take them as directed. Finish them even if you start to feel better. °· If gauze is used, follow your caregiver's directions for changing the gauze. °· To avoid spreading the infection: °· Keep your draining abscess covered with a  bandage. °· Wash your hands well. °· Do not share personal care items, towels, or whirlpools with others. °· Avoid skin contact with others. °· Keep your skin and clothes clean around the abscess. °· Keep all follow-up appointments as directed by your caregiver. °SEEK MEDICAL CARE IF:  °· You have increased pain, swelling, redness, fluid drainage, or bleeding. °· You have muscle aches, chills, or a general ill feeling. °· You have a fever. °MAKE SURE YOU:  °· Understand these instructions. °· Will watch your condition. °· Will get help right away if you are not doing well or get worse. °  °This information is not intended to replace advice given to you by your health care provider. Make sure you discuss any questions you have with your health care provider. °  °Document Released: 06/27/2005 Document Revised: 03/18/2012 Document Reviewed: 11/30/2011 °Elsevier Interactive Patient Education ©2016 [employer] ° °Incision and Drainage °Incision and drainage is a procedure in which a sac-like structure (cystic structure) is opened and drained. The area to be drained usually contains material such as pus, fluid, or blood.  °LET YOUR CAREGIVER KNOW ABOUT:  °· Allergies to medicine. °· Medicines taken, including vitamins, herbs, eyedrops, over-the-counter medicines, and creams. °· Use of steroids (by mouth or creams). °· Previous problems with anesthetics or numbing medicines. °· History of bleeding problems or blood clots. °· Previous surgery. °· Other health problems, including diabetes and kidney problems. °· Possibility of pregnancy, if this applies. °RISKS AND COMPLICATIONS °· Pain. °· Bleeding. °· Scarring. °· Infection. °BEFORE THE PROCEDURE  °  You may need to have an ultrasound or other imaging tests to see how large or deep your cystic structure is. Blood tests may also be used to determine if you have an infection or how severe the infection is. You may need to have a tetanus shot. PROCEDURE  The affected area  is cleaned with a cleaning fluid. The cyst area will then be numbed with a medicine (local anesthetic). A small incision will be made in the cystic structure. A syringe or catheter may be used to drain the contents of the cystic structure, or the contents may be squeezed out. The area will then be flushed with a cleansing solution. After cleansing the area, it is often gently packed with a gauze or another wound dressing. Once it is packed, it will be covered with gauze and tape or some other type of wound dressing. AFTER THE PROCEDURE   Often, you will be allowed to go home right after the procedure.  You may be given antibiotic medicine to prevent or heal an infection.  If the area was packed with gauze or some other wound dressing, you will likely need to come back in 1 to 2 days to get it removed.  The area should heal in about 14 days.   This information is not intended to replace advice given to you by your health care provider. Make sure you discuss any questions you have with your health care provider.   Wash with antibacterial soap and water. Otherwise keep clean and dry. Follow up with the [facility] in 3-4 days for a wound re-check. Return to the ED if you experience severe worsening of your symptoms, fever, chills, redness or swelling around your wound, difficulty having a bowel movement.

## 2016-04-04 NOTE — ED Notes (Signed)
Patient here with painful abscess to right buttocks x 2-3 days. Complains of nausea with same. denies drainage.

## 2016-04-04 NOTE — ED Provider Notes (Signed)
CSN: [id number]     Arrival date & time 04/04/16  1410 History  By signing my name below, I, Freida Busmaniana Omoyeni, attest that this documentation has been prepared under the direction and in the presence of non-physician practitioner, Gaylyn RongSamantha Dowless, PA-C. Electronically Signed: Freida Busmaniana Omoyeni, Scribe. 04/04/2016. 2:54 PM.    Chief Complaint  Patient presents with  . Abscess   The history is provided by the patient. No language interpreter was used.     HPI Comments:  Renee Stone is a 30 y.o. female who presents to the Emergency Department complaining of slowly growing, painful, bump to her right buttock which she first noticed a few days ago. She reports associated redness to the site.  She has applied a lidocaine patch without relief. She has also squeezed the site but has not seen much discharge. She denies fever and chills. Pt also denies h/o DM, IVDA, and HIV Pt is currently on coumadin.    History reviewed. No pertinent past medical history. Past Surgical History  Procedure Laterality Date  . Cholecystectomy    . Mitral valve replacement     No family history on file. Social History  Substance Use Topics  . Smoking status: Current Every Day Smoker    Types: Cigarettes  . Smokeless tobacco: None  . Alcohol Use: None   OB History    No data available     Review of Systems  10 systems reviewed and all are negative for acute change except as noted in the HPI.    Allergies  Morphine and related  Home Medications   Prior to Admission medications   Medication Sig Start Date End Date Taking? Authorizing Provider  albuterol (PROAIR HFA) 108 (90 Base) MCG/ACT inhaler Inhale 1 puff into the lungs every 6 (six) hours as needed. wheezing 10/04/14  Yes Historical Provider, MD  buPROPion (WELLBUTRIN XL) 150 MG 24 hr tablet Take 150 mg by mouth daily. 04/07/15  Yes Historical Provider, MD  cefUROXime (CEFTIN) 500 MG tablet Take 500 mg by mouth 2 (two) times daily. 03/20/16  Yes Historical  Provider, MD  lidocaine (LIDODERM) 5 % Place 2 patches onto the skin every 12 (twelve) hours. 03/30/16  Yes Historical Provider, MD  LORazepam (ATIVAN) 1 MG tablet Take 1 mg by mouth 2 (two) times daily. 03/08/16  Yes Historical Provider, MD  Oxycodone HCl 10 MG TABS Take 10 mg by mouth 4 (four) times daily as needed. pain 03/20/16  Yes Historical Provider, MD  TRINESSA, 28, 0.18/0.215/0.25 MG-35 MCG tablet Take 1 tablet by mouth daily. 03/03/16  Yes Historical Provider, MD  warfarin (COUMADIN) 5 MG tablet Take 5 mg by mouth daily. 03/16/16  Yes Historical Provider, MD  XTAMPZA ER 13.5 MG C12A Take 13.5 mg by mouth every 12 (twelve) hours. 03/20/16  Yes Historical Provider, MD   BP 133/65 mmHg  Pulse 102  Temp(Src) 98.7 F (37.1 C) (Oral)  Resp 16  Ht 5\' 4"  (1.626 m)  Wt 207 lb (93.895 kg)  BMI 35.51 kg/m2  SpO2 97% Physical Exam  Constitutional: She is oriented to person, place, and time. She appears well-developed and well-nourished. No distress.  HENT:  Head: Normocephalic and atraumatic.  Eyes: Conjunctivae are normal. Right eye exhibits no discharge. Left eye exhibits no discharge. No scleral icterus.  Cardiovascular: Normal rate.   Pulmonary/Chest: Effort normal.  Neurological: She is alert and oriented to person, place, and time. Coordination normal.  Skin: Skin is warm and dry. She is not diaphoretic. There is  erythema. No pallor.  3 cm fluctuance mass on left glute with overlying erythema and minimal purulent drainage No surrounding induration around rectum   Psychiatric: She has a normal mood and affect. Her behavior is normal.  Nursing note and vitals reviewed.   ED Course  Procedures  DIAGNOSTIC STUDIES:  Oxygen Saturation is 97% on RA, normal by my interpretation.    COORDINATION OF CARE:  2:52 PM Discussed treatment plan with pt at bedside and pt agreed to plan.   INCISION AND DRAINAGE PROCEDURE NOTE: Patient identification was confirmed and verbal consent was  obtained. This procedure was performed by Gaylyn RongSamantha Dowless, PA-C at 3:24 PM. Site: left glute Sterile procedures observed Needle size: 25 Anesthetic used (type and amt): lidocain 1% without epi Blade size: 11 Drainage: copious Complexity: Complex Site anesthetized, incision made over site, wound drained and explored loculations, rinsed with copious amounts of normal saline, wound packed with sterile gauze, covered with dry, sterile dressing.  Pt tolerated procedure well without complications.  Instructions for care discussed verbally and pt provided with additional written instructions for homecare and f/u.   MDM   Final diagnoses:  Abscess, gluteal, right    Patient with skin abscess. Incision and drainage performed in the ED today.  Abscess was not large enough to warrant packing or drain placement. Wound recheck in 2 days. Supportive care and return precautions discussed. No abx indicated. The patient appears reasonably screened and/or stabilized for discharge and I doubt any other emergent medical condition requiring further screening, evaluation, or treatment in the ED prior to discharge.   I personally performed the services described in this documentation, which was scribed in my presence. The recorded information has been reviewed and is accurate.     Lester KinsmanSamantha Tripp De BorgiaDowless, PA-C 04/04/16 [id number]  Glynn OctaveStephen Rancour, MD 04/04/16 [phone]

## 2017-05-17 DIAGNOSIS — Z8041 Family history of malignant neoplasm of ovary: Secondary | ICD-10-CM | POA: Diagnosis not present

## 2017-05-17 DIAGNOSIS — D509 Iron deficiency anemia, unspecified: Secondary | ICD-10-CM | POA: Diagnosis not present

## 2017-05-17 DIAGNOSIS — F17210 Nicotine dependence, cigarettes, uncomplicated: Secondary | ICD-10-CM | POA: Diagnosis not present
